# Patient Record
Sex: Female | Born: 1960 | Race: White | Hispanic: No | Marital: Married | Smoking: Current some day smoker
Health system: Southern US, Community
[De-identification: ages and names within clinical notes are randomized; demographics above are authoritative.]

## PROBLEM LIST (undated history)

## (undated) DIAGNOSIS — M1990 Unspecified osteoarthritis, unspecified site: Secondary | ICD-10-CM

## (undated) DIAGNOSIS — R29898 Other symptoms and signs involving the musculoskeletal system: Secondary | ICD-10-CM

## (undated) DIAGNOSIS — M542 Cervicalgia: Secondary | ICD-10-CM

## (undated) HISTORY — PX: ABDOMINAL HYSTERECTOMY: SHX81

## (undated) HISTORY — PX: APPENDECTOMY: SHX54

## (undated) HISTORY — PX: OTHER SURGICAL HISTORY: SHX169

## (undated) HISTORY — PX: TONSILLECTOMY: SUR1361

## (undated) HISTORY — PX: PLANTAR FASCIA SURGERY: SHX746

---

## 2005-05-15 ENCOUNTER — Encounter: Admission: RE | Admit: 2005-05-15 | Discharge: 2005-05-15 | Payer: Self-pay | Admitting: Neurological Surgery

## 2011-05-01 ENCOUNTER — Encounter (HOSPITAL_COMMUNITY): Payer: Self-pay | Admitting: Pharmacy Technician

## 2011-05-01 ENCOUNTER — Encounter (HOSPITAL_COMMUNITY)
Admission: RE | Admit: 2011-05-01 | Discharge: 2011-05-01 | Disposition: A | Discharge status: Home / Self Care | Payer: Managed Care, Other (non HMO) | Source: Ambulatory Visit | Attending: Neurological Surgery | Admitting: Neurological Surgery

## 2011-05-01 ENCOUNTER — Encounter (HOSPITAL_COMMUNITY): Payer: Self-pay

## 2011-05-01 HISTORY — DX: Other symptoms and signs involving the musculoskeletal system: R29.898

## 2011-05-01 HISTORY — DX: Cervicalgia: M54.2

## 2011-05-01 HISTORY — DX: Unspecified osteoarthritis, unspecified site: M19.90

## 2011-05-01 LAB — DIFFERENTIAL
Basophils Absolute: 0 10*3/uL (ref 0.0–0.1)
Basophils Relative: 0 % (ref 0–1)
Eosinophils Absolute: 0.1 10*3/uL (ref 0.0–0.7)
Monocytes Relative: 8 % (ref 3–12)
Neutro Abs: 3.9 10*3/uL (ref 1.7–7.7)

## 2011-05-01 LAB — BASIC METABOLIC PANEL
BUN: 20 mg/dL (ref 6–23)
CO2: 27 mEq/L (ref 19–32)
Calcium: 9.4 mg/dL (ref 8.4–10.5)
GFR calc Af Amer: >90 mL/min (ref >90)
GFR calc non Af Amer: >90 mL/min (ref >90)
Sodium: 143 mEq/L (ref 135–145)

## 2011-05-01 LAB — SURGICAL PCR SCREEN
MRSA, PCR: NEGATIVE
Staphylococcus aureus: POSITIVE — AB

## 2011-05-01 LAB — PROTIME-INR
INR: 0.94 (ref 0.00–1.49)
Prothrombin Time: 12.8 seconds (ref 11.6–15.2)

## 2011-05-01 LAB — CBC
Platelets: 327 10*3/uL (ref 150–400)
RBC: 4.06 MIL/uL (ref 3.87–5.11)
WBC: 6.3 10*3/uL (ref 4.0–10.5)

## 2011-05-01 LAB — APTT: aPTT: 32 seconds (ref 24–37)

## 2011-05-01 NOTE — Pre-Procedure Instructions (Signed)
20 Sierra Pena  05/01/2011   Your procedure is scheduled on: march 1  Report to [facility] at 1010 AM.  Call this number if you have problems the morning of surgery: [phone]   Remember:   Do not eat food:After Midnight.  May have clear liquids: up to 4 Hours before arrival.  Clear liquids include soda, tea, black coffee, apple or grape juice, broth.  Take these medicines the morning of surgery with A SIP OF WATER: hydrocodone lorazepam   Do not wear jewelry, make-up or nail polish.  Do not wear lotions, powders, or perfumes. You may wear deodorant.  Do not shave 48 hours prior to surgery.  Do not bring valuables to the hospital.  Contacts, dentures or bridgework may not be worn into surgery.  Leave suitcase in the car. After surgery it may be brought to your room.  For patients admitted to the hospital, checkout time is 11:00 AM the day of discharge.   Patients discharged the day of surgery will not be allowed to drive home.  Name and phone number of your driver: family  Special Instructions: CHG Shower Use Special Wash: 1/2 bottle night before surgery and 1/2 bottle morning of surgery.   Please read over the following fact sheets that you were given: MRSA Information and Surgical Site Infection Prevention

## 2011-05-01 NOTE — Progress Notes (Signed)
Requested ekg cxr ov dr bulla [phone]

## 2011-05-04 NOTE — Progress Notes (Signed)
Requested again ekg,cxr, and last ofice note from Dr.Bulla.

## 2011-05-06 NOTE — Consult Note (Signed)
Anesthesiology chart review:  Simrin Silman's chart was reviewed. The data appears acceptable for her planned surgery on 05/08/2011.  Kipp Brood M.D.

## 2011-05-07 MED ORDER — CEFAZOLIN SODIUM 1-5 GM-% IV SOLN
1.0000 g | INTRAVENOUS | Status: AC
  Administered 2011-05-08: 1 g via INTRAVENOUS
  Filled 2011-05-07: qty 50

## 2011-05-08 ENCOUNTER — Ambulatory Visit (HOSPITAL_COMMUNITY): Payer: Managed Care, Other (non HMO) | Admitting: Anesthesiology

## 2011-05-08 ENCOUNTER — Encounter (HOSPITAL_COMMUNITY): Payer: Self-pay | Admitting: Neurological Surgery

## 2011-05-08 ENCOUNTER — Encounter (HOSPITAL_COMMUNITY): Payer: Self-pay | Admitting: Anesthesiology

## 2011-05-08 ENCOUNTER — Ambulatory Visit (HOSPITAL_COMMUNITY)
Admission: RE | Admit: 2011-05-08 | Discharge: 2011-05-09 | Disposition: A | Discharge status: Home / Self Care | Payer: Managed Care, Other (non HMO) | Source: Ambulatory Visit | Attending: Neurological Surgery | Admitting: Neurological Surgery

## 2011-05-08 ENCOUNTER — Ambulatory Visit (HOSPITAL_COMMUNITY): Payer: Managed Care, Other (non HMO)

## 2011-05-08 ENCOUNTER — Encounter (HOSPITAL_COMMUNITY)
Admission: RE | Disposition: A | Discharge status: Home / Self Care | Payer: Self-pay | Source: Ambulatory Visit | Attending: Neurological Surgery

## 2011-05-08 DIAGNOSIS — Z01812 Encounter for preprocedural laboratory examination: Secondary | ICD-10-CM | POA: Insufficient documentation

## 2011-05-08 DIAGNOSIS — M47812 Spondylosis without myelopathy or radiculopathy, cervical region: Secondary | ICD-10-CM

## 2011-05-08 DIAGNOSIS — M069 Rheumatoid arthritis, unspecified: Secondary | ICD-10-CM | POA: Insufficient documentation

## 2011-05-08 DIAGNOSIS — M5000 Cervical disc disorder with myelopathy, unspecified cervical region: Secondary | ICD-10-CM | POA: Insufficient documentation

## 2011-05-08 DIAGNOSIS — M4712 Other spondylosis with myelopathy, cervical region: Secondary | ICD-10-CM | POA: Insufficient documentation

## 2011-05-08 HISTORY — PX: ANTERIOR CERVICAL DECOMP/DISCECTOMY FUSION: SHX1161

## 2011-05-08 SURGERY — ANTERIOR CERVICAL DECOMPRESSION/DISCECTOMY FUSION 1 LEVEL
Anesthesia: General | Site: Spine Cervical | Wound class: Clean

## 2011-05-08 MED ORDER — ONDANSETRON HCL 4 MG/2ML IJ SOLN: 4.0000 mg | Freq: Once | INTRAMUSCULAR | Status: DC | PRN

## 2011-05-08 MED ORDER — LORAZEPAM 1 MG PO TABS: 1.0000 mg | ORAL_TABLET | Freq: Every day | ORAL | Status: DC

## 2011-05-08 MED ORDER — 0.9 % SODIUM CHLORIDE (POUR BTL) OPTIME
TOPICAL | Status: DC | PRN
  Administered 2011-05-08: 1000 mL

## 2011-05-08 MED ORDER — ONDANSETRON HCL 4 MG/2ML IJ SOLN
INTRAMUSCULAR | Status: DC | PRN
  Administered 2011-05-08: 4 mg via INTRAVENOUS

## 2011-05-08 MED ORDER — THROMBIN 5000 UNITS EX SOLR
OROMUCOSAL | Status: DC | PRN
  Administered 2011-05-08: 14:00:00 via TOPICAL

## 2011-05-08 MED ORDER — SODIUM CHLORIDE 0.9 % IV SOLN
INTRAVENOUS | Status: AC
  Filled 2011-05-08: qty 500

## 2011-05-08 MED ORDER — MENTHOL 3 MG MT LOZG: 1.0000 | LOZENGE | OROMUCOSAL | Status: DC | PRN

## 2011-05-08 MED ORDER — GLYCOPYRROLATE 0.2 MG/ML IJ SOLN
INTRAMUSCULAR | Status: DC | PRN
  Administered 2011-05-08: 0.6 mg via INTRAVENOUS

## 2011-05-08 MED ORDER — LACTATED RINGERS IV SOLN
INTRAVENOUS | Status: DC | PRN
  Administered 2011-05-08 (×2): via INTRAVENOUS

## 2011-05-08 MED ORDER — HYDROMORPHONE HCL PF 1 MG/ML IJ SOLN
0.2500 mg | INTRAMUSCULAR | Status: DC | PRN
  Administered 2011-05-08 (×4): 0.5 mg via INTRAVENOUS

## 2011-05-08 MED ORDER — SUFENTANIL CITRATE 50 MCG/ML IV SOLN
INTRAVENOUS | Status: DC | PRN
  Administered 2011-05-08: 25 ug via INTRAVENOUS
  Administered 2011-05-08: 5 ug via INTRAVENOUS

## 2011-05-08 MED ORDER — LIDOCAINE HCL (CARDIAC) 20 MG/ML IV SOLN
INTRAVENOUS | Status: DC | PRN
  Administered 2011-05-08: 100 mg via INTRAVENOUS

## 2011-05-08 MED ORDER — ONDANSETRON HCL 4 MG/2ML IJ SOLN
4.0000 mg | INTRAMUSCULAR | Status: DC | PRN
  Administered 2011-05-08: 4 mg via INTRAVENOUS
  Filled 2011-05-08: qty 2

## 2011-05-08 MED ORDER — MEPERIDINE HCL 25 MG/ML IJ SOLN: 6.2500 mg | INTRAMUSCULAR | Status: DC | PRN

## 2011-05-08 MED ORDER — HYDROMORPHONE HCL PF 1 MG/ML IJ SOLN
INTRAMUSCULAR | Status: AC
  Filled 2011-05-08: qty 1

## 2011-05-08 MED ORDER — ROCURONIUM BROMIDE 100 MG/10ML IV SOLN
INTRAVENOUS | Status: DC | PRN
  Administered 2011-05-08: 50 mg via INTRAVENOUS

## 2011-05-08 MED ORDER — HYDROCODONE-ACETAMINOPHEN 10-325 MG PO TABS
1.0000 | ORAL_TABLET | Freq: Four times a day (QID) | ORAL | Status: DC | PRN
Start: 2011-05-08 — End: 2011-05-09

## 2011-05-08 MED ORDER — MORPHINE SULFATE 4 MG/ML IJ SOLN
1.0000 mg | INTRAMUSCULAR | Status: DC | PRN
  Administered 2011-05-08 – 2011-05-09 (×2): 4 mg via INTRAVENOUS
  Filled 2011-05-08 (×2): qty 1

## 2011-05-08 MED ORDER — THROMBIN 5000 UNITS EX KIT
PACK | CUTANEOUS | Status: DC | PRN
  Administered 2011-05-08 (×2): 5000 [IU] via TOPICAL

## 2011-05-08 MED ORDER — HEMOSTATIC AGENTS (NO CHARGE) OPTIME
TOPICAL | Status: DC | PRN
  Administered 2011-05-08: 1 via TOPICAL

## 2011-05-08 MED ORDER — ACETAMINOPHEN 325 MG PO TABS: 650.0000 mg | ORAL_TABLET | ORAL | Status: DC | PRN

## 2011-05-08 MED ORDER — DEXAMETHASONE SODIUM PHOSPHATE 10 MG/ML IJ SOLN
10.0000 mg | Freq: Once | INTRAMUSCULAR | Status: DC
  Filled 2011-05-08: qty 1

## 2011-05-08 MED ORDER — SODIUM CHLORIDE 0.9 % IV SOLN
10.0000 mg | INTRAVENOUS | Status: DC | PRN
  Administered 2011-05-08: 20 ug/min via INTRAVENOUS

## 2011-05-08 MED ORDER — SODIUM CHLORIDE 0.9 % IJ SOLN
3.0000 mL | Freq: Two times a day (BID) | INTRAMUSCULAR | Status: DC
  Administered 2011-05-08: 3 mL via INTRAVENOUS

## 2011-05-08 MED ORDER — MORPHINE SULFATE 2 MG/ML IJ SOLN: 0.0500 mg/kg | INTRAMUSCULAR | Status: DC | PRN

## 2011-05-08 MED ORDER — CEFAZOLIN SODIUM 1-5 GM-% IV SOLN
1.0000 g | Freq: Three times a day (TID) | INTRAVENOUS | Status: AC
  Administered 2011-05-08 – 2011-05-09 (×2): 1 g via INTRAVENOUS
  Filled 2011-05-08 (×2): qty 50

## 2011-05-08 MED ORDER — NEOSTIGMINE METHYLSULFATE 1 MG/ML IJ SOLN
INTRAMUSCULAR | Status: DC | PRN
  Administered 2011-05-08: 4 mg via INTRAVENOUS

## 2011-05-08 MED ORDER — PHENOL 1.4 % MT LIQD: 1.0000 | OROMUCOSAL | Status: DC | PRN

## 2011-05-08 MED ORDER — DEXTROSE 5 % IV SOLN
500.0000 mg | Freq: Four times a day (QID) | INTRAVENOUS | Status: DC | PRN
  Administered 2011-05-08: 500 mg via INTRAVENOUS
  Filled 2011-05-08: qty 5

## 2011-05-08 MED ORDER — DEXAMETHASONE SODIUM PHOSPHATE 10 MG/ML IJ SOLN
INTRAMUSCULAR | Status: AC
  Administered 2011-05-08: 10 mg via INTRAVENOUS
  Filled 2011-05-08: qty 1

## 2011-05-08 MED ORDER — METHOCARBAMOL 500 MG PO TABS
500.0000 mg | ORAL_TABLET | Freq: Four times a day (QID) | ORAL | Status: DC | PRN
  Administered 2011-05-09: 500 mg via ORAL
  Filled 2011-05-08 (×2): qty 1

## 2011-05-08 MED ORDER — PREGABALIN 75 MG PO CAPS
150.0000 mg | ORAL_CAPSULE | Freq: Every day | ORAL | Status: DC
  Administered 2011-05-08: 150 mg via ORAL
  Filled 2011-05-08: qty 6

## 2011-05-08 MED ORDER — PROPOFOL 10 MG/ML IV EMUL
INTRAVENOUS | Status: DC | PRN
  Administered 2011-05-08: 130 mg via INTRAVENOUS

## 2011-05-08 MED ORDER — ACETAMINOPHEN 650 MG RE SUPP: 650.0000 mg | RECTAL | Status: DC | PRN

## 2011-05-08 MED ORDER — DEXAMETHASONE 4 MG PO TABS
4.0000 mg | ORAL_TABLET | Freq: Four times a day (QID) | ORAL | Status: DC
  Administered 2011-05-08 – 2011-05-09 (×2): 4 mg via ORAL
  Filled 2011-05-08 (×2): qty 1

## 2011-05-08 MED ORDER — ROPINIROLE HCL 1 MG PO TABS
2.0000 mg | ORAL_TABLET | Freq: Every day | ORAL | Status: DC
  Administered 2011-05-08: 2 mg via ORAL
  Filled 2011-05-08 (×2): qty 2

## 2011-05-08 MED ORDER — BUPIVACAINE HCL (PF) 0.25 % IJ SOLN
INTRAMUSCULAR | Status: DC | PRN
  Administered 2011-05-08: 5 mL

## 2011-05-08 MED ORDER — DEXAMETHASONE SODIUM PHOSPHATE 4 MG/ML IJ SOLN
4.0000 mg | Freq: Four times a day (QID) | INTRAMUSCULAR | Status: DC
  Administered 2011-05-08: 4 mg via INTRAVENOUS
  Filled 2011-05-08 (×5): qty 1

## 2011-05-08 MED ORDER — SODIUM CHLORIDE 0.9 % IJ SOLN: 3.0000 mL | INTRAMUSCULAR | Status: DC | PRN

## 2011-05-08 MED ORDER — MIDAZOLAM HCL 5 MG/5ML IJ SOLN
INTRAMUSCULAR | Status: DC | PRN
  Administered 2011-05-08 (×2): 1 mg via INTRAVENOUS

## 2011-05-08 MED ORDER — SODIUM CHLORIDE 0.9 % IR SOLN
Status: DC | PRN
  Administered 2011-05-08: 14:00:00

## 2011-05-08 MED ORDER — BACITRACIN 50000 UNITS IM SOLR
INTRAMUSCULAR | Status: AC
  Filled 2011-05-08: qty 1

## 2011-05-08 MED ORDER — POTASSIUM CHLORIDE IN NACL 20-0.9 MEQ/L-% IV SOLN
INTRAVENOUS | Status: DC
  Filled 2011-05-08 (×3): qty 1000

## 2011-05-08 SURGICAL SUPPLY — 53 items
APL SKNCLS STERI-STRIP NONHPOA (GAUZE/BANDAGES/DRESSINGS) ×1
BAG DECANTER FOR FLEXI CONT (MISCELLANEOUS) ×2 IMPLANT
BENZOIN TINCTURE PRP APPL 2/3 (GAUZE/BANDAGES/DRESSINGS) ×2 IMPLANT
BUR MATCHSTICK NEURO 3.0 LAGG (BURR) ×2 IMPLANT
CANISTER SUCTION 2500CC (MISCELLANEOUS) ×2 IMPLANT
CLOTH BEACON ORANGE TIMEOUT ST (SAFETY) ×2 IMPLANT
CONT SPEC 4OZ CLIKSEAL STRL BL (MISCELLANEOUS) ×2 IMPLANT
DRAPE C-ARM 42X72 X-RAY (DRAPES) ×4 IMPLANT
DRAPE LAPAROTOMY 100X72 PEDS (DRAPES) ×2 IMPLANT
DRAPE MICROSCOPE LEICA (MISCELLANEOUS) ×1 IMPLANT
DRAPE MICROSCOPE ZEISS OPMI (DRAPES) ×1 IMPLANT
DRAPE POUCH INSTRU U-SHP 10X18 (DRAPES) ×2 IMPLANT
DRESSING TELFA 8X3 (GAUZE/BANDAGES/DRESSINGS) ×2 IMPLANT
DRSG OPSITE 4X5.5 SM (GAUZE/BANDAGES/DRESSINGS) ×2 IMPLANT
DURAPREP 6ML APPLICATOR 50/CS (WOUND CARE) ×2 IMPLANT
ELECT COATED BLADE 2.86 ST (ELECTRODE) ×2 IMPLANT
ELECT REM PT RETURN 9FT ADLT (ELECTROSURGICAL) ×2
ELECTRODE REM PT RTRN 9FT ADLT (ELECTROSURGICAL) ×1 IMPLANT
GAUZE SPONGE 4X4 16PLY XRAY LF (GAUZE/BANDAGES/DRESSINGS) IMPLANT
GLOVE BIO SURGEON STRL SZ8 (GLOVE) ×2 IMPLANT
GLOVE BIOGEL PI IND STRL 7.0 (GLOVE) IMPLANT
GLOVE BIOGEL PI IND STRL 8 (GLOVE) IMPLANT
GLOVE BIOGEL PI INDICATOR 7.0 (GLOVE) ×2
GLOVE BIOGEL PI INDICATOR 8 (GLOVE) ×1
GLOVE ECLIPSE 6.5 STRL STRAW (GLOVE) ×1 IMPLANT
GLOVE ECLIPSE 7.5 STRL STRAW (GLOVE) ×1 IMPLANT
GLOVE SURG SS PI 6.5 STRL IVOR (GLOVE) ×2 IMPLANT
GOWN BRE IMP SLV AUR LG STRL (GOWN DISPOSABLE) ×2 IMPLANT
GOWN BRE IMP SLV AUR XL STRL (GOWN DISPOSABLE) ×1 IMPLANT
GOWN STRL REIN 2XL LVL4 (GOWN DISPOSABLE) ×2 IMPLANT
HEMOSTAT POWDER KIT SURGIFOAM (HEMOSTASIS) ×2 IMPLANT
INVIZIA DRILL BIT ×1 IMPLANT
KIT BASIN OR (CUSTOM PROCEDURE TRAY) ×2 IMPLANT
KIT ROOM TURNOVER OR (KITS) ×2 IMPLANT
NDL HYPO 25X1 1.5 SAFETY (NEEDLE) ×1 IMPLANT
NDL SPNL 20GX3.5 QUINCKE YW (NEEDLE) ×1 IMPLANT
NEEDLE HYPO 25X1 1.5 SAFETY (NEEDLE) ×2 IMPLANT
NEEDLE SPNL 20GX3.5 QUINCKE YW (NEEDLE) ×2 IMPLANT
NS IRRIG 1000ML POUR BTL (IV SOLUTION) ×2 IMPLANT
PACK LAMINECTOMY NEURO (CUSTOM PROCEDURE TRAY) ×2 IMPLANT
PAD ARMBOARD 7.5X6 YLW CONV (MISCELLANEOUS) ×4 IMPLANT
PUTTY BONE DBX 2.5 MIS (Bone Implant) ×1 IMPLANT
RUBBERBAND STERILE (MISCELLANEOUS) ×4 IMPLANT
SPONGE INTESTINAL PEANUT (DISPOSABLE) ×2 IMPLANT
SPONGE SURGIFOAM ABS GEL SZ50 (HEMOSTASIS) ×2 IMPLANT
STRIP CLOSURE SKIN 1/2X4 (GAUZE/BANDAGES/DRESSINGS) ×2 IMPLANT
SUT VIC AB 3-0 SH 8-18 (SUTURE) ×3 IMPLANT
SYR 20ML ECCENTRIC (SYRINGE) ×2 IMPLANT
TOWEL OR 17X24 6PK STRL BLUE (TOWEL DISPOSABLE) ×2 IMPLANT
TOWEL OR 17X26 10 PK STRL BLUE (TOWEL DISPOSABLE) ×2 IMPLANT
TRAP SPECIMEN MUCOUS 40CC (MISCELLANEOUS) ×1 IMPLANT
Vista S Angled Peek 11x14x6mm ×1 IMPLANT
WATER STERILE IRR 1000ML POUR (IV SOLUTION) ×2 IMPLANT

## 2011-05-08 NOTE — Op Note (Signed)
05/08/2011  2:02 PM  PATIENT:  Sierra Pena  51 y.o. 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 Decompressive anterior cervical discectomy C5-6, 2. Anterior cervical arthrodesis C5-6 utilizing a 7 mm peek interbody cage packed with local autograft and morcellized allograft, 3. Anterior cervical plating C5-6 utilizing a Zimmer plate  SURGEON:  Marikay Alar, MD  ASSISTANTS: None  ANESTHESIA:   General  EBL: 20 ml  Total I/O In: 1000 [I.V.:1000] Out: -   BLOOD ADMINISTERED:none  DRAINS: None   SPECIMEN:  No Specimen  INDICATION FOR PROCEDURE: This patient presented with severe left arm pain. R. I showed significant spondylosis at C5-6 with a spur to the left causing C6 nerve root compression. She tried medical management without relief. I recommended ACDF with plating at C5-6. Patient understood the risks, benefits, and alternatives and potential outcomes and wished to proceed.  PROCEDURE DETAILS: Patient was brought to the operating room placed under general endotracheal anesthesia. Patient was placed in the supine position on the operating room table. The neck was prepped with Duraprep and draped in a sterile fashion.   Three cc of local anesthesia was injected and a transverse incision was made on the right side of the neck.  Dissection was carried down thru the subcutaneous tissue and the platysma was  elevated, opened, and undermined with Metzenbaum scissors.  Dissection was then carried out thru an avascular plane leaving the sternocleidomastoid carotid artery and jugular vein laterally and the trachea and esophagus medially. The ventral aspect of the vertebral column was identified and a localizing x-ray was taken. The C5-6 level was identified. The longus colli muscles were then elevated and the retractor was placed. The annulus was incised and the disc space entered. Discectomy was  performed with micro-curettes and pituitary rongeurs. I then used the high-speed drill to drill the endplates down to the level of the posterior longitudinal ligament. The drill shavings were saved in a mucous trap for later arthrodesis. The operating microscope was draped and brought into the field provided additional magnification, illumination and visualization. Discectomy was continued posteriorly thru the disc space. Posterior longitudinal ligament was opened with a nerve hook, and then removed along with disc herniation and osteophytes, decompressing the spinal canal and thecal sac. We then continued to remove osteophytic overgrowth and disc material decompressing the neural foramina and exiting nerve roots bilaterally. The scope was angled up and down to help decompress and undercut the vertebral bodies. Once the decompression was completed we could pass a nerve hook circumferentially to assure adequate decompression in the midline and in the neural foramina. So by both visualization and palpation we felt we had an adequate decompression of the neural elements. We then measured the height of the intravertebral disc space and selected a 7 millimeter Peek interbody cage packed with autograft and morcellized allograft. It was then gently positioned in the intravertebral disc space and countersunk. I then used a Zimmer plate and placed four variable angle screws into the vertebral bodies and locked them into position. The wound was irrigated with bacitracin solution, checked for hemostasis which was established and confirmed. Once meticulous hemostasis was achieved, we then proceeded with closure. The platysma was closed with interrupted 3-0 undyed Vicryl suture, the subcuticular layer was closed with interrupted 3-0 undyed Vicryl suture. The skin edges were approximated with steristrips. The drapes were removed. A sterile dressing was applied. The patient was then awakened from general  anesthesia and transferred  to the recovery room in stable condition. At the end of the procedure all sponge, needle and instrument counts were correct.   PLAN OF CARE: Admit for overnight observation  PATIENT DISPOSITION:  PACU - hemodynamically stable.   Delay start of Pharmacological VTE agent (>24hrs) due to surgical blood loss or risk of bleeding:  yes

## 2011-05-08 NOTE — H&P (Signed)
Subjective:   Patient is a 51 y.o. female admitted for ACDF C5-6. The patient first presented to me with complaints of neck pain. Onset of symptoms was a few months ago. The pain is described as aching and occurs intermittently. The pain is rated moderate, and is located at the with radiation to arms. The symptoms have been progressive. Symptoms are exacerbated by extending head backwards, and are relieved by rest. History positive for upper extremity pain: bilateral extensor surface of forearm. Previous work up includes MRI of cervical spine, results: spondylosis c5-6.  Past Medical History  Diagnosis Date  . Arthritis     rheumatoid followed by dr gay  . Neck pain on left side   . Weakness of left arm     Past Surgical History  Procedure Date  . Abdominal hysterectomy   . Appendectomy   . Tonsillectomy   . Cesarean section   . Radial nerve     impingement  . Plantar fascia surgery     No Known Allergies  History  Substance Use Topics  . Smoking status: Current Some Day Smoker -- 1.0 packs/day for 30 years    Types: Cigarettes  . Smokeless tobacco: Not on file  . Alcohol Use: No    No family history on file. Prior to Admission medications   Medication Sig Start Date End Date Taking? Authorizing Provider  fenofibrate micronized (LOFIBRA) 134 MG capsule Take 134 mg by mouth daily before breakfast.   Yes Historical Provider, MD  folic acid (FOLVITE) 1 MG tablet Take 1 mg by mouth daily.   Yes Historical Provider, MD  HYDROcodone-acetaminophen (NORCO) 10-325 MG per tablet Take 1 tablet by mouth every 6 (six) hours as needed. For pain   Yes Historical Provider, MD  LORazepam (ATIVAN) 1 MG tablet Take 1 mg by mouth daily.   Yes Historical Provider, MD  methotrexate (RHEUMATREX) 2.5 MG tablet Take 17.5 mg by mouth once a week. Every Thursday   Yes Historical Provider, MD  pregabalin (LYRICA) 75 MG capsule Take 150 mg by mouth at bedtime.    Yes Historical Provider, MD  rOPINIRole  (REQUIP) 2 MG tablet Take 2 mg by mouth at bedtime.   Yes Historical Provider, MD     Review of Systems  Positive ROS: neg  All other systems have been reviewed and were otherwise negative with the exception of those mentioned in the HPI and as above.  Objective: Vital signs in last 24 hours:    General Appearance: Alert, cooperative, no distress, appears stated age Head: Normocephalic, without obvious abnormality, atraumatic Eyes: PERRL, conjunctiva/corneas clear, EOM's intact, fundi benign, both eyes      Ears: Normal TM's and external ear canals, both ears Throat: Lips, mucosa, and tongue normal; teeth and gums normal Neck: Supple, symmetrical, trachea midline, no adenopathy; thyroid: No enlargement/tenderness/nodules; no carotid bruit or JVD Back: Symmetric, no curvature, ROM normal, no CVA tenderness Lungs: Clear to auscultation bilaterally, respirations unlabored Heart: Regular rate and rhythm, S1 and S2 normal, no murmur, rub or gallop Abdomen: Soft, non-tender, bowel sounds active all four quadrants, no masses, no organomegaly Extremities: Extremities normal, atraumatic, no cyanosis or edema Pulses: 2+ and symmetric all extremities Skin: Skin color, texture, turgor normal, no rashes or lesions  NEUROLOGIC:  Mental status: Alert and oriented x4, no aphasia, good attention span, fund of knowledge and memory  Motor Exam - grossly normal Sensory Exam - grossly normal Reflexes: normal Coordination - grossly normal Gait - grossly normal Balance -  grossly normal Cranial Nerves: I: smell Not tested  II: visual acuity  OS: nl    OD: nl  II: visual fields Full to confrontation  II: pupils Equal, round, reactive to light  III,VII: ptosis None  III,IV,VI: extraocular muscles  Full ROM  V: mastication Normal  V: facial light touch sensation  Normal  V,VII: corneal reflex  Present  VII: facial muscle function - upper  Normal  VII: facial muscle function - lower Normal  VIII:  hearing Not tested  IX: soft palate elevation  Normal  IX,X: gag reflex Present  XI: trapezius strength  5/5  XI: sternocleidomastoid strength 5/5  XI: neck flexion strength  5/5  XII: tongue strength  Normal    Data Review Lab Results  Component Value Date   WBC 6.3 05/01/2011   HGB 12.3 05/01/2011   HCT 36.4 05/01/2011   MCV 89.7 05/01/2011   PLT 327 05/01/2011   Lab Results  Component Value Date   NA 143 05/01/2011   K 3.7 05/01/2011   CL 106 05/01/2011   CO2 27 05/01/2011   BUN 20 05/01/2011   CREATININE 0.71 05/01/2011   GLUCOSE 133* 05/01/2011   Lab Results  Component Value Date   INR 0.94 05/01/2011    Assessment:   Cervical neck pain with herniated nucleus pulposus/ spondylosis/ stenosis at C5-6. Patient has failed conservative therapy. Recommended ACDF C5-6  Plan:   I explained the condition and procedure to the patient and answered any questions.  Patient wishes to proceed with procedure as planned. Understands risks/ benefits/ and expected or typical outcomes.  Wayde Gopaul S 05/08/2011 9:39 AM

## 2011-05-08 NOTE — Progress Notes (Signed)
cxr in epic

## 2011-05-08 NOTE — Anesthesia Postprocedure Evaluation (Signed)
Anesthesia Post Note  Patient: Sierra Pena  Procedure(s) Performed: Procedure(s) (LRB): ANTERIOR CERVICAL DECOMPRESSION/DISCECTOMY FUSION 1 LEVEL (N/A)  Anesthesia type: general  Patient location: PACU  Post pain: Pain level controlled  Post assessment: Patient's Cardiovascular Status Stable  Last Vitals:  Filed Vitals:   05/08/11 1545  BP:   Pulse: 83  Temp: 36.6 C  Resp: 25    Post vital signs: Reviewed and stable  Level of consciousness: sedated  Complications: No apparent anesthesia complications

## 2011-05-08 NOTE — Anesthesia Preprocedure Evaluation (Signed)
Anesthesia Evaluation  Patient identified by MRN, date of birth, ID band Patient awake    Reviewed: Allergy & Precautions, H&P , NPO status , Patient's Chart, lab work & pertinent test results  Airway Mallampati: I TM Distance: >3 FB Neck ROM: Full    Dental   Pulmonary          Cardiovascular     Neuro/Psych    GI/Hepatic   Endo/Other    Renal/GU      Musculoskeletal   Abdominal   Peds  Hematology   Anesthesia Other Findings   Reproductive/Obstetrics                           Anesthesia Physical Anesthesia Plan  ASA: II  Anesthesia Plan: General   Post-op Pain Management:    Induction: Intravenous  Airway Management Planned: Oral ETT  Additional Equipment:   Intra-op Plan:   Post-operative Plan: Extubation in OR  Informed Consent: I have reviewed the patients History and Physical, chart, labs and discussed the procedure including the risks, benefits and alternatives for the proposed anesthesia with the patient or authorized representative who has indicated his/her understanding and acceptance.     Plan Discussed with: CRNA and Surgeon  Anesthesia Plan Comments:         Anesthesia Quick Evaluation  

## 2011-05-08 NOTE — Transfer of Care (Signed)
Immediate Anesthesia Transfer of Care Note  Patient: Sierra Pena  Procedure(s) Performed: Procedure(s) (LRB): ANTERIOR CERVICAL DECOMPRESSION/DISCECTOMY FUSION 1 LEVEL (N/A)  Patient Location: PACU  Anesthesia Type: General  Level of Consciousness: awake, sedated and patient cooperative  Airway & Oxygen Therapy: Patient Spontanous Breathing and Patient connected to nasal cannula oxygen  Post-op Assessment: Report given to PACU RN, Post -op Vital signs reviewed and stable and Patient moving all extremities  Post vital signs: Reviewed and stable  Complications: No apparent anesthesia complications

## 2011-05-08 NOTE — Anesthesia Procedure Notes (Signed)
Procedure Name: Intubation Date/Time: 05/08/2011 12:50 PM Performed by: Darcey Nora Pre-anesthesia Checklist: Patient identified, Emergency Drugs available, Suction available and Patient being monitored Patient Re-evaluated:Patient Re-evaluated prior to inductionOxygen Delivery Method: Circle system utilized Preoxygenation: Pre-oxygenation with 100% oxygen Intubation Type: IV induction Ventilation: Mask ventilation without difficulty Laryngoscope Size: Mac and 3 Grade View: Grade II Tube type: Oral Tube size: 7.5 mm Number of attempts: 1 Airway Equipment and Method: Stylet Placement Confirmation: ETT inserted through vocal cords under direct vision,  positive ETCO2 and breath sounds checked- equal and bilateral Secured at: 21 (cm at teeth) cm Tube secured with: Tape Dental Injury: Teeth and Oropharynx as per pre-operative assessment

## 2011-05-08 NOTE — Preoperative (Signed)
Beta Blockers   Reason not to administer Beta Blockers:Not Applicable 

## 2011-05-09 MED ORDER — DIAZEPAM 5 MG PO TABS: 5.0000 mg | ORAL_TABLET | Freq: Four times a day (QID) | ORAL | Status: AC | PRN

## 2011-05-09 MED ORDER — HYDROCODONE-ACETAMINOPHEN 10-325 MG PO TABS: 1.0000 | ORAL_TABLET | ORAL | Status: AC | PRN

## 2011-05-09 NOTE — Discharge Summary (Signed)
Physician Discharge Summary  Patient ID: Sierra Pena MRN: [id number] DOB/AGE: 11/14/1960 51 y.o.  Admit date: 05/08/2011 Discharge date: 05/09/2011  Admission Diagnoses: Herniated nucleus pulposus C5-C6 with radiculopathy  Discharge Diagnoses: Related nucleus pulposus C5-C6 with radiculopathy Active Problems:  * No active hospital problems. *    Discharged Condition: good  Hospital Course: She was admitted to undergo surgical decompression of C5-C6 she tolerated procedure well postoperatively she has minimal complaints of neck discomfort her arms are feeling better she is ambulatory and is discharged home area  Consults: None  Significant Diagnostic Studies: labs: None  Treatments: Anterior cervical decompression C5-C6 arthrodesis with peek spacer and allograft anterior plate fixation M5-H8  Discharge Exam: Blood pressure 104/58, pulse 72, temperature 98.1 F (36.7 C), temperature source Oral, resp. rate 20, SpO2 96.00%. Incision clean and dry motor strength intact.  Disposition: Discharge home  Discharge Orders    Future Orders Please Complete By Expires   Diet - low sodium heart healthy      Increase activity slowly      Discharge instructions      Comments:   Leave dressing on for 2 days. After that remove carefully okay to shower leave incision open. Do not apply salves   Call MD for:  temperature >100.4      Call MD for:  severe uncontrolled pain      Call MD for:  redness, tenderness, or signs of infection (pain, swelling, redness, odor or green/yellow discharge around incision site)        Medication List  As of 05/09/2011  9:21 AM   TAKE these medications         diazepam 5 MG tablet   Commonly known as: VALIUM   Take 1 tablet (5 mg total) by mouth every 6 (six) hours as needed (Muscle spasm).      fenofibrate micronized 134 MG capsule   Commonly known as: LOFIBRA   Take 134 mg by mouth daily before breakfast.      folic acid 1 MG tablet   Commonly known  as: FOLVITE   Take 1 mg by mouth daily.      HYDROcodone-acetaminophen 10-325 MG per tablet   Commonly known as: NORCO   Take 1 tablet by mouth every 6 (six) hours as needed. For pain      HYDROcodone-acetaminophen 10-325 MG per tablet   Commonly known as: NORCO   Take 1 tablet by mouth every 4 (four) hours as needed for pain.      LORazepam 1 MG tablet   Commonly known as: ATIVAN   Take 1 mg by mouth daily.      methotrexate 2.5 MG tablet   Commonly known as: RHEUMATREX   Take 17.5 mg by mouth once a week. Every Thursday      pregabalin 75 MG capsule   Commonly known as: LYRICA   Take 150 mg by mouth at bedtime.      rOPINIRole 2 MG tablet   Commonly known as: REQUIP   Take 2 mg by mouth at bedtime.           Follow-up Information    Follow up with JONES,DAVID S, MD. Schedule an appointment as soon as possible for a visit in 3 weeks. (Call for appointment)    Contact information:   [address] [place] [address] [phone]          Signed: Stefani Dama 05/09/2011, 9:21 AM

## 2011-05-09 NOTE — Discharge Instructions (Signed)
Wound Care Keep incision covered and dry for one week.  If you shower prior to then, cover incision with plastic wrap.  You may remove outer bandage after one week and shower.  Do not put any creams, lotions, or ointments on incision. Leave steri-strips on neck.  They will fall off by themselves. Activity Walk each and every day, increasing distance each day. No lifting greater than 5 lbs.  Avoid excessive neck motion. No driving for 2 weeks; may ride as a passenger locally. Wear neck brace at all times except when showering or otherwise instructed. Diet Resume your normal diet.  Return to Work Will be discussed at you follow up appointment. Call Your Doctor If Any of These Occur Redness, drainage, or swelling at the wound.  Temperature greater than 101 degrees. Severe pain not relieved by pain medication. Increased difficulty swallowing.  Incision starts to come apart. Follow Up Appt Call today for appointment in 1-2 weeks ([phone]) or for problems.  If you have any hardware placed in your spine, you will need an x-ray before your appointment.   

## 2011-05-13 ENCOUNTER — Encounter (HOSPITAL_COMMUNITY): Payer: Self-pay | Admitting: Neurological Surgery

## 2011-08-17 ENCOUNTER — Other Ambulatory Visit: Payer: Self-pay | Admitting: Neurological Surgery

## 2011-08-17 DIAGNOSIS — M545 Low back pain: Secondary | ICD-10-CM

## 2011-08-22 ENCOUNTER — Ambulatory Visit
Admission: RE | Admit: 2011-08-22 | Discharge: 2011-08-22 | Disposition: A | Discharge status: Home / Self Care | Payer: Managed Care, Other (non HMO) | Source: Ambulatory Visit | Attending: Neurological Surgery | Admitting: Neurological Surgery

## 2011-08-22 DIAGNOSIS — M545 Low back pain: Secondary | ICD-10-CM

## 2016-11-12 ENCOUNTER — Other Ambulatory Visit: Payer: Self-pay | Admitting: Orthopaedic Surgery

## 2016-11-12 DIAGNOSIS — M5106 Intervertebral disc disorders with myelopathy, lumbar region: Secondary | ICD-10-CM

## 2016-11-20 ENCOUNTER — Other Ambulatory Visit: Payer: Managed Care, Other (non HMO)

## 2016-11-27 ENCOUNTER — Ambulatory Visit
Admission: RE | Admit: 2016-11-27 | Discharge: 2016-11-27 | Disposition: A | Discharge status: Home / Self Care | Payer: BLUE CROSS/BLUE SHIELD | Source: Ambulatory Visit | Attending: Orthopaedic Surgery | Admitting: Orthopaedic Surgery

## 2016-11-27 DIAGNOSIS — M5106 Intervertebral disc disorders with myelopathy, lumbar region: Secondary | ICD-10-CM

## 2016-11-27 MED ORDER — OXYCODONE-ACETAMINOPHEN 5-325 MG PO TABS
1.0000 | ORAL_TABLET | Freq: Once | ORAL | Status: AC
  Administered 2016-11-27: 1 via ORAL

## 2016-11-27 MED ORDER — MEPERIDINE HCL 100 MG/ML IJ SOLN
50.0000 mg | Freq: Once | INTRAMUSCULAR | Status: AC
  Administered 2016-11-27: 50 mg via INTRAMUSCULAR

## 2016-11-27 MED ORDER — ONDANSETRON HCL 4 MG/2ML IJ SOLN
4.0000 mg | Freq: Once | INTRAMUSCULAR | Status: AC
  Administered 2016-11-27: 4 mg via INTRAMUSCULAR

## 2016-11-27 MED ORDER — IOPAMIDOL (ISOVUE-M 200) INJECTION 41%
15.0000 mL | Freq: Once | INTRAMUSCULAR | Status: AC
  Administered 2016-11-27: 15 mL via INTRATHECAL

## 2016-11-27 MED ORDER — DIAZEPAM 5 MG PO TABS
10.0000 mg | ORAL_TABLET | Freq: Once | ORAL | Status: AC
  Administered 2016-11-27: 10 mg via ORAL

## 2016-11-27 NOTE — Discharge Instructions (Signed)

## 2018-12-06 ENCOUNTER — Other Ambulatory Visit: Payer: Self-pay | Admitting: Orthopaedic Surgery

## 2018-12-06 DIAGNOSIS — M545 Low back pain, unspecified: Secondary | ICD-10-CM

## 2018-12-25 ENCOUNTER — Other Ambulatory Visit: Payer: BLUE CROSS/BLUE SHIELD

## 2019-01-14 ENCOUNTER — Other Ambulatory Visit: Payer: Self-pay

## 2019-04-03 IMAGING — CT CT L SPINE W/ CM
1 of 6 series · 6 of 14 positions shown, 8 images · non-contrast
Comparison: MRI lumbar spine 06/12/2016.

CLINICAL DATA: Low back pain. LEFT leg pain. Previous lumbar
surgery.
TECHNIQUE: Contiguous axial images were obtained through the Lumbar spine after
the intrathecal infusion of infusion. Coronal and sagittal
reconstructions were obtained of the axial image sets.

[Series 3: l spine soft · axial · 0.29mm/px · z∈[-293,-131]mm · 6 of 76 slices shown, 8 images]
[im 11/76  soft-tissue]
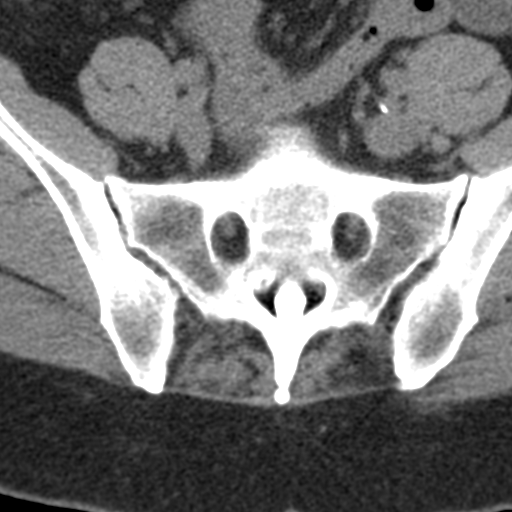
[im 11/76  bone]
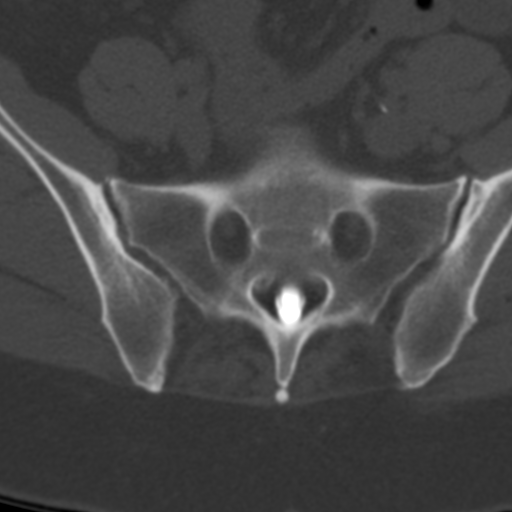
[im 22/76  bone]
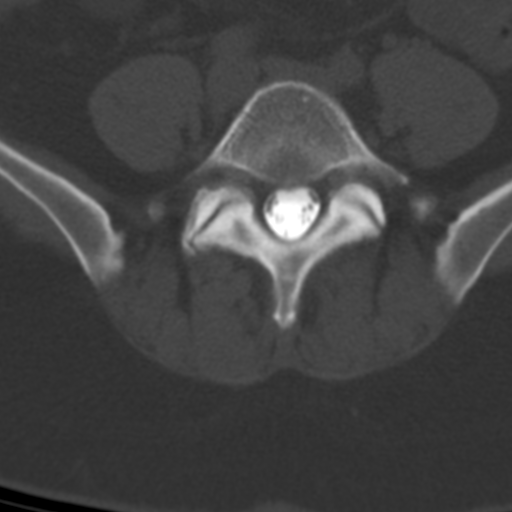
[im 33/76  bone]
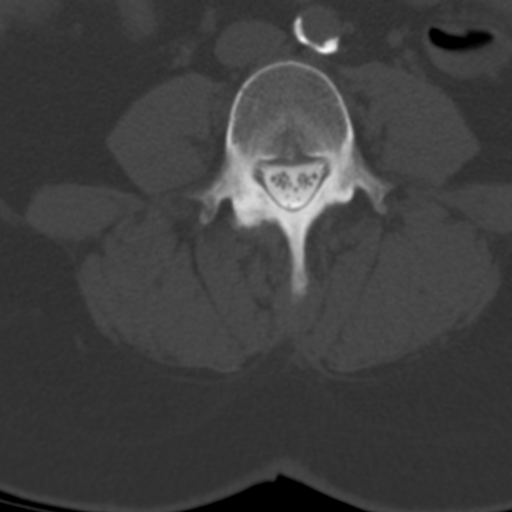
[im 43/76  bone]
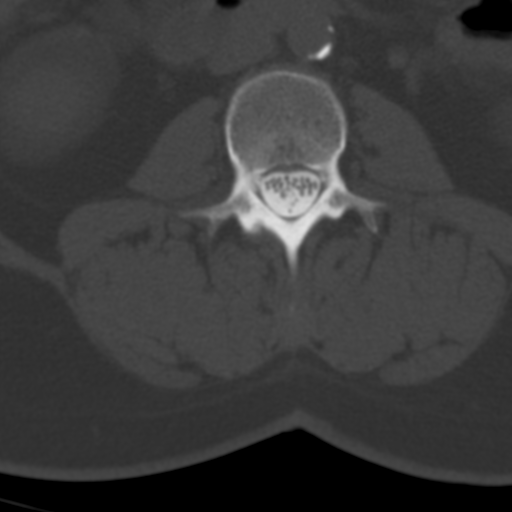
[im 54/76  soft-tissue]
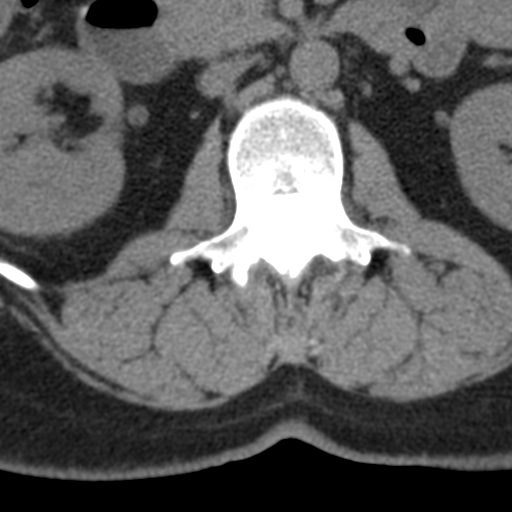
[im 54/76  bone]
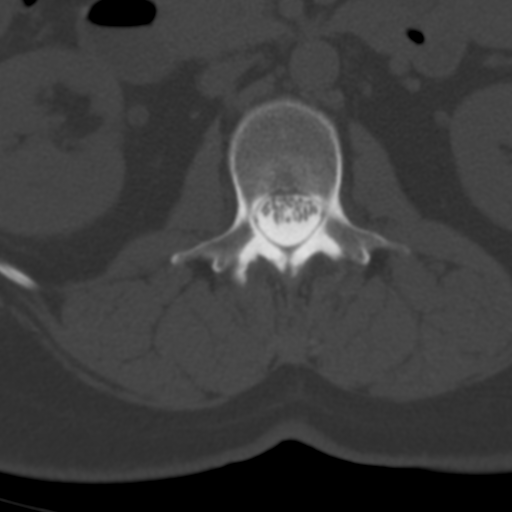
[im 65/76  bone]
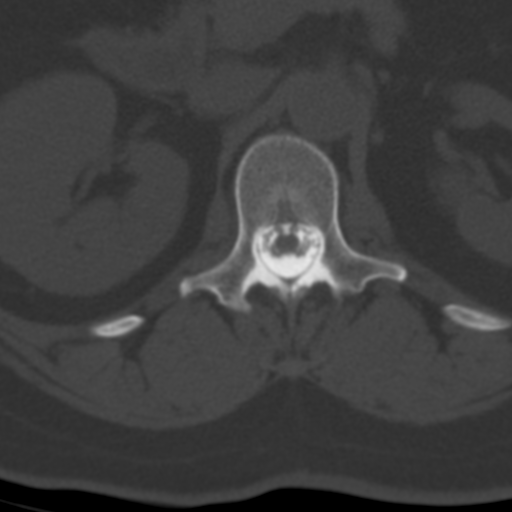

[6 of 14 positions shown; findings below may reference images not displayed]

EXAM:
LUMBAR MYELOGRAM

FLUOROSCOPY TIME:  17 seconds corresponding to a Dose Area Product
of 200.7 ?Gy*m2

PROCEDURE:
After thorough discussion of risks and benefits of the procedure
including bleeding, infection, injury to nerves, blood vessels,
adjacent structures as well as headache and CSF leak, written and
oral informed consent was obtained. Consent was obtained by Dr. Meighan
Ayazo. Time out form was completed.

Patient was positioned prone on the fluoroscopy table. Local
anesthesia was provided with 1% lidocaine without epinephrine after
prepped and draped in the usual sterile fashion. Puncture was
performed at L4-5 using a 3 1/2 inch 22-gauge spinal needle via
midline approach. Using a single pass through the dura, the needle
was placed within the thecal sac, with return of clear CSF. 15 mL of
Isovue-M 200 was injected into the thecal sac, with normal
opacification of the nerve roots and cauda equina consistent with
free flow within the subarachnoid space.

I personally performed the lumbar puncture and administered the
intrathecal contrast. I also personally supervised acquisition of
the myelogram images.
FINDINGS: LUMBAR MYELOGRAM FINDINGS:

Good opacification lumbar subarachnoid space. No nerve root cut off
or spinal stenosis.

Anatomic alignment with patient prone for myelography. Prior L4-5
LEFT laminotomy.

Standing AP, lateral, flexion extension views demonstrate shallow
ventral defects at L3-4 and L4-5, noncompressive. No dynamic
instability throughout good range of motion.

CT LUMBAR MYELOGRAM FINDINGS:

Segmentation: Normal.

Alignment:  Normal.

Vertebrae: No worrisome osseous lesion.

Conus medullaris: Normal in size. Slightly low termination upper L2.

Paraspinal tissues: No evidence for hydronephrosis or paravertebral
mass. Advanced aortic atherosclerosis without aneurysmal dilatation.

Disc levels:

L1-L2:  Normal.

L2-L3: Minimal annular bulge. Slight posterior element hypertrophy.
No impingement.

L3-L4:  Annular bulge.  Facet arthropathy.  No impingement.

L4-L5: LEFT laminotomy. Annular bulge. Facet arthropathy. Minimal
subarticular zone narrowing on the RIGHT, but no definite L5 nerve
root impingement. Slight foraminal narrowing bilaterally, without L4
nerve root compromise.

L5-S1: Unremarkable disc space. Mild facet arthropathy. No
impingement.

Compared with prior MR, similar appearance.
IMPRESSION: LUMBAR MYELOGRAM IMPRESSION:

Postsurgical changes L4-5, LEFT. No stenosis or new/residual
extradural defect.

Anatomic alignment without dynamic instability.

CT LUMBAR MYELOGRAM IMPRESSION:

Minor lumbar spondylosis without a significant or dominant
compressive lesion.

No recurrent protrusion at L4-5, or postsurgical adverse features,
are evident.

Aortic atherosclerosis.

## 2022-02-09 ENCOUNTER — Other Ambulatory Visit: Payer: Self-pay | Admitting: Orthopaedic Surgery

## 2022-02-09 DIAGNOSIS — M5106 Intervertebral disc disorders with myelopathy, lumbar region: Secondary | ICD-10-CM

## 2022-03-08 ENCOUNTER — Ambulatory Visit
Admission: RE | Admit: 2022-03-08 | Discharge: 2022-03-08 | Disposition: A | Discharge status: Home / Self Care | Payer: Self-pay | Source: Ambulatory Visit | Attending: Orthopaedic Surgery | Admitting: Orthopaedic Surgery

## 2022-03-08 DIAGNOSIS — M5106 Intervertebral disc disorders with myelopathy, lumbar region: Secondary | ICD-10-CM

## 2022-03-08 MED ORDER — GADOPICLENOL 0.5 MMOL/ML IV SOLN
7.0000 mL | Freq: Once | INTRAVENOUS | Status: AC | PRN
  Administered 2022-03-08: 7 mL via INTRAVENOUS
